# Patient Record
Sex: Female | Born: 2000 | Race: White | Hispanic: No | Marital: Married | Smoking: Never smoker
Health system: Southern US, Community
[De-identification: ages and names within clinical notes are randomized; demographics above are authoritative.]

## PROBLEM LIST (undated history)

## (undated) DIAGNOSIS — O99711 Diseases of the skin and subcutaneous tissue complicating pregnancy, first trimester: Secondary | ICD-10-CM

## (undated) DIAGNOSIS — L309 Dermatitis, unspecified: Secondary | ICD-10-CM

## (undated) DIAGNOSIS — R569 Unspecified convulsions: Secondary | ICD-10-CM

## (undated) HISTORY — DX: Dermatitis, unspecified: L30.9

## (undated) HISTORY — DX: Diseases of the skin and subcutaneous tissue complicating pregnancy, first trimester: O99.711

## (undated) HISTORY — DX: Unspecified convulsions: R56.9

---
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
## 2018-05-14 ENCOUNTER — Ambulatory Visit (INDEPENDENT_AMBULATORY_CARE_PROVIDER_SITE_OTHER): Payer: Self-pay | Admitting: Physician Assistant

## 2018-05-14 ENCOUNTER — Encounter: Payer: Self-pay | Admitting: Physician Assistant

## 2018-05-14 ENCOUNTER — Other Ambulatory Visit: Payer: Self-pay

## 2018-05-14 DIAGNOSIS — R21 Rash and other nonspecific skin eruption: Secondary | ICD-10-CM

## 2018-05-14 DIAGNOSIS — L2084 Intrinsic (allergic) eczema: Secondary | ICD-10-CM

## 2018-05-14 MED ORDER — MOMETASONE FUROATE 0.1 % EX OINT: TOPICAL_OINTMENT | Freq: Every day | CUTANEOUS | 2 refills | Status: AC

## 2018-05-14 MED ORDER — METHYLPREDNISOLONE ACETATE 80 MG/ML IJ SUSP
80.0000 mg | Freq: Once | INTRAMUSCULAR | Status: AC
  Administered 2018-05-15: 80 mg via INTRAMUSCULAR

## 2018-05-14 MED ORDER — CRISABOROLE 2 % EX OINT: 1.0000 "application " | TOPICAL_OINTMENT | Freq: Two times a day (BID) | CUTANEOUS | 5 refills | Status: AC

## 2018-05-14 NOTE — Progress Notes (Signed)
Telephone visit  Subjective: ZH:[id number]:eczema PCP: Junie SpencerHawks, Christy A, FNP ION:[id number]:Trinia Kem Kaysichol Witkop is a 18 y.o. female calls for telephone consult today. Patient provides verbal consent for consult held via phone.  Patient is identified with 2 separate identifiers.  At this time the entire area is on COVID-19 social distancing and stay home orders are in place.  Patient is of higher risk and therefore we are performing this by a virtual method.  Location of patient: home Location of provider: [facility] Others present for call: no  This patient has had ongoing eczema problems for several years.  Couple years ago it was so bad that she even got it infected.  She even had to take a shot of prednisone.  All of the medications she is tried is not been helping.  She is tried triamcinolone, betamethasone, fluocinolone.  And they have not been helping. She has never used Eucrisa   ROS: Per HPI  Allergies  Allergen Reactions  . Peanut-Containing Drug Products Hives, Itching, Nausea And Vomiting, Swelling and Rash  . Lexapro [Escitalopram Oxalate]     Made her feel hot all over with headache.   Past Medical History:  Diagnosis Date  . Eczema   . Seizures (HCC)   . Skin and subcutaneous tissue disease complicating pregnancy in first trimester     Current Outpatient Medications:  .  betamethasone valerate ointment (VALISONE) 0.1 %, Apply 1 application topically 2 (two) times daily., Disp: 60 g, Rfl: 0 .  Crisaborole (EUCRISA) 2 % OINT, Apply 1 application topically 2 (two) times daily., Disp: 100 g, Rfl: 5 .  fluticasone (FLONASE) 50 MCG/ACT nasal spray, USE 2 SPRAYS INTO BOTH NOSTRILS DAILY., Disp: 16 g, Rfl: 4 .  levETIRAcetam (KEPPRA XR) 500 MG 24 hr tablet, TAKE 2 tablets by mouth every night., Disp: 60 tablet, Rfl: 7 .  mometasone (ELOCON) 0.1 % ointment, Apply topically daily., Disp: 45 g, Rfl: 2 .  norgestimate-ethinyl estradiol (SPRINTEC 28) 0.25-35 MG-MCG tablet, Take 1 tablet by mouth  daily., Disp: 3 Package, Rfl: 4 .  polyethylene glycol powder (GLYCOLAX/MIRALAX) powder, Take 17 g by mouth 2 (two) times daily as needed., Disp: 3350 g, Rfl: 1 .  vortioxetine HBr (TRINTELLIX) 10 MG TABS, Take 1 tablet (10 mg total) by mouth daily., Disp: 90 tablet, Rfl: 1  Current Facility-Administered Medications:  .  [START ON 05/15/2018] methylPREDNISolone acetate (DEPO-MEDROL) injection 80 mg, 80 mg, Intramuscular, Once, Remus LofflerJones, Yvon Mccord S, PA-C  Assessment/ Plan: 18 y.o. female   1. Rash - methylPREDNISolone acetate (DEPO-MEDROL) injection 80 mg  2. Intrinsic eczema - methylPREDNISolone acetate (DEPO-MEDROL) injection 80 mg - mometasone (ELOCON) 0.1 % ointment; Apply topically daily.  Dispense: 45 g; Refill: 2 - Crisaborole (EUCRISA) 2 % OINT; Apply 1 application topically 2 (two) times daily.  Dispense: 100 g; Refill: 5   Start time: 3:48 pm End time: 3:59 PM  Meds ordered this encounter  Medications  . methylPREDNISolone acetate (DEPO-MEDROL) injection 80 mg  . mometasone (ELOCON) 0.1 % ointment    Sig: Apply topically daily.    Dispense:  45 g    Refill:  2    Order Specific Question:   Supervising Provider    Answer:   Raliegh IpGOTTSCHALK, ASHLY M [[id number]]  . Crisaborole (EUCRISA) 2 % OINT    Sig: Apply 1 application topically 2 (two) times daily.    Dispense:  100 g    Refill:  5    Order Specific Question:   Supervising Provider  Answer:   Raliegh Ip [[id number]]    Prudy Feeler PA-C [facility] [phone]

## 2018-05-15 ENCOUNTER — Ambulatory Visit (INDEPENDENT_AMBULATORY_CARE_PROVIDER_SITE_OTHER): Payer: Self-pay | Admitting: *Deleted

## 2018-05-15 ENCOUNTER — Other Ambulatory Visit: Payer: Self-pay

## 2018-05-15 DIAGNOSIS — R21 Rash and other nonspecific skin eruption: Secondary | ICD-10-CM

## 2018-05-15 DIAGNOSIS — L2084 Intrinsic (allergic) eczema: Secondary | ICD-10-CM

## 2018-05-15 DIAGNOSIS — L209 Atopic dermatitis, unspecified: Secondary | ICD-10-CM

## 2018-05-15 NOTE — Progress Notes (Signed)
Pt given Methylprednisolone inj Tolerated well 

## 2018-06-04 ENCOUNTER — Telehealth: Payer: Self-pay | Admitting: Physician Assistant

## 2018-06-04 DIAGNOSIS — R8290 Unspecified abnormal findings in urine: Secondary | ICD-10-CM

## 2018-06-04 DIAGNOSIS — R300 Dysuria: Secondary | ICD-10-CM

## 2018-06-04 MED ORDER — NITROFURANTOIN MONOHYD MACRO 100 MG PO CAPS: 100.0000 mg | ORAL_CAPSULE | Freq: Two times a day (BID) | ORAL | 0 refills | Status: AC

## 2018-06-04 NOTE — Progress Notes (Signed)
We are sorry that you are not feeling well.  Here is how we plan to help!  Based on what you shared with me it looks like you most likely have a simple urinary tract infection. Let treat the UTI and if the vaginal discharge does not improve please submit another e-visit in about 48 hours.   A UTI (Urinary Tract Infection) is a bacterial infection of the bladder.  Most cases of urinary tract infections are simple to treat but a key part of your care is to encourage you to drink plenty of fluids and watch your symptoms carefully.  I have prescribed MacroBid 100 mg twice a day for 5 days.  Your symptoms should gradually improve. Call us if the burning in your urine worsens, you develop worsening fever, back pain or pelvic pain or if your symptoms do not resolve after completing the antibiotic.  Urinary tract infections can be prevented by drinking plenty of water to keep your body hydrated.  Also be sure when you wipe, wipe from front to back and don't hold it in!  If possible, empty your bladder every 4 hours.  Your e-visit answers were reviewed by a board certified advanced clinical practitioner to complete your personal care plan.  Depending on the condition, your plan could have included both over the counter or prescription medications.  If there is a problem please reply once you have received a response from your provider.  Your safety is important to [place].  If you have drug allergies check your prescription carefully.    You can use MyChart to ask questions about today's visit, request a non-urgent call back, or ask for a work or school excuse for 24 hours related to this e-Visit. If it has been greater than 24 hours you will need to follow up with your provider, or enter a new e-Visit to address those concerns.   You will get an e-mail in the next two days asking about your experience.  I hope that your e-visit has been valuable and will speed your recovery. Thank you for using  e-visits.    ===View-only below this line===   ----- Message -----    From: Cynthia Macdonald    Sent: 06/04/2018 11:41 AM EDT      To: E-Visit Mailing List Subject: E-Visit Submission: Urinary Problems  E-Visit Submission: Urinary Problems --------------------------------  Question: Which of the following are you experiencing? Answer:   Pain while passing urine  Question: When you have pain when passing urine, which of these apply? Answer:   I have a burning sensation  Question: Are you able to pass urine? Answer:   Yes, I can pass urine.  Question: How long have you had pain or difficulty passing urine? Answer:   More  than two days but less than one week  Question: Do you have a fever? Answer:   No, I do not have a fever  Question: Do you have any of the following? Answer:   I have belly pain with this illness  Question: Do you have an exaggerated sensation of the need to pass urine? Answer:   Yes, the sensation is exaggerated  Question: Do you have the urge to urinate more of less frequently than normal? Answer:   More frequently  Question: What does your urine look like? Answer:   It is cloudy  Question: Do you have any of the following? Answer:   An unusual smell  Question: Do you have any of the following?  Answer:   A white discharge  Question: Do you have any sores on your genitals? Answer:   No  Question: Do you have any history of kidney dysfunction or kidney problems? Answer:   No  Question: Within the past 3 months, have you had any surgery on your kidneys or bladder, or have you had a tube inserted to collect your urine? Answer:   No, I have never had either  Question: Have you had similar symptoms in the past? Answer:   Yes, I have had similar symptoms more than once before  Question: If you had similar symptoms in the past, did any of the following work? Answer:   Pills for urine infection            Pills for yeast  infection  Question: Please list any additional comments  Answer:     Question: Please list your medication allergies that you may have ? (If 'none' , please list as 'none') Answer:   Lexapro  Question: Are you pregnant? Answer:   I am confident that I am not pregnant  Question: Are you breastfeeding? Answer:   No  A total of 5-10 minutes was spent evaluating this patients questionnaire and formulating a plan of care.

## 2018-06-11 ENCOUNTER — Other Ambulatory Visit: Payer: Self-pay | Admitting: Family

## 2018-06-11 DIAGNOSIS — Z3041 Encounter for surveillance of contraceptive pills: Secondary | ICD-10-CM

## 2018-06-11 NOTE — Telephone Encounter (Signed)
Hawks. NTBS LOV for OCP 03/26/17. 30 days sent to pharmacy
